# Patient Record
Sex: Male | Born: 1990 | Race: White | Hispanic: No | Marital: Single | Smoking: Never smoker
Health system: Southern US, Community
[De-identification: ages and names within clinical notes are randomized; demographics above are authoritative.]

## PROBLEM LIST (undated history)

## (undated) DIAGNOSIS — F909 Attention-deficit hyperactivity disorder, unspecified type: Secondary | ICD-10-CM

---

## 2015-03-27 ENCOUNTER — Encounter (HOSPITAL_COMMUNITY): Payer: Self-pay | Admitting: Emergency Medicine

## 2015-03-27 DIAGNOSIS — J159 Unspecified bacterial pneumonia: Secondary | ICD-10-CM | POA: Diagnosis not present

## 2015-03-27 DIAGNOSIS — Z8659 Personal history of other mental and behavioral disorders: Secondary | ICD-10-CM | POA: Insufficient documentation

## 2015-03-27 DIAGNOSIS — R251 Tremor, unspecified: Secondary | ICD-10-CM | POA: Insufficient documentation

## 2015-03-27 DIAGNOSIS — R110 Nausea: Secondary | ICD-10-CM | POA: Diagnosis not present

## 2015-03-27 DIAGNOSIS — R509 Fever, unspecified: Secondary | ICD-10-CM | POA: Diagnosis present

## 2015-03-27 NOTE — ED Notes (Signed)
C/o fever, generalized body aches, and productive cough with brown sputum since this morning.  States he took 2-3 Excedrin with Caffeine around 7pm for fever. Now c/o shaking all over.

## 2015-03-28 ENCOUNTER — Emergency Department (HOSPITAL_COMMUNITY)
Admission: EM | Admit: 2015-03-28 | Discharge: 2015-03-28 | Disposition: A | Discharge status: Home / Self Care | Payer: BLUE CROSS/BLUE SHIELD | Attending: Emergency Medicine | Admitting: Emergency Medicine

## 2015-03-28 ENCOUNTER — Emergency Department (HOSPITAL_COMMUNITY): Payer: BLUE CROSS/BLUE SHIELD

## 2015-03-28 DIAGNOSIS — R059 Cough, unspecified: Secondary | ICD-10-CM

## 2015-03-28 DIAGNOSIS — R05 Cough: Secondary | ICD-10-CM

## 2015-03-28 DIAGNOSIS — J189 Pneumonia, unspecified organism: Secondary | ICD-10-CM

## 2015-03-28 HISTORY — DX: Attention-deficit hyperactivity disorder, unspecified type: F90.9

## 2015-03-28 LAB — COMPREHENSIVE METABOLIC PANEL
ALT: 18 U/L (ref 17–63)
AST: 28 U/L (ref 15–41)
Albumin: 4.5 g/dL (ref 3.5–5.0)
Alkaline Phosphatase: 57 U/L (ref 38–126)
Anion gap: 11 (ref 5–15)
BUN: 6 mg/dL (ref 6–20)
CHLORIDE: 104 mmol/L (ref 101–111)
CO2: 26 mmol/L (ref 22–32)
Calcium: 9.6 mg/dL (ref 8.9–10.3)
Creatinine, Ser: 1.23 mg/dL (ref 0.61–1.24)
GFR calc Af Amer: >60 mL/min (ref >60)
GFR calc non Af Amer: >60 mL/min (ref >60)
GLUCOSE: 98 mg/dL (ref 65–99)
Potassium: 3.5 mmol/L (ref 3.5–5.1)
Sodium: 141 mmol/L (ref 135–145)
Total Bilirubin: 0.5 mg/dL (ref 0.3–1.2)
Total Protein: 7.1 g/dL (ref 6.5–8.1)

## 2015-03-28 LAB — CBC WITH DIFFERENTIAL/PLATELET
Basophils Absolute: 0 10*3/uL (ref 0.0–0.1)
Basophils Relative: 0 % (ref 0–1)
EOS PCT: 0 % (ref 0–5)
Eosinophils Absolute: 0 10*3/uL (ref 0.0–0.7)
HCT: 42.1 % (ref 39.0–52.0)
HEMOGLOBIN: 14.9 g/dL (ref 13.0–17.0)
LYMPHS ABS: 1.3 10*3/uL (ref 0.7–4.0)
Lymphocytes Relative: 17 % (ref 12–46)
MCH: 31.4 pg (ref 26.0–34.0)
MCHC: 35.4 g/dL (ref 30.0–36.0)
MCV: 88.6 fL (ref 78.0–100.0)
Monocytes Absolute: 0.8 10*3/uL (ref 0.1–1.0)
Monocytes Relative: 11 % (ref 3–12)
Neutro Abs: 5.2 10*3/uL (ref 1.7–7.7)
Neutrophils Relative %: 72 % (ref 43–77)
PLATELETS: 204 10*3/uL (ref 150–400)
RBC: 4.75 MIL/uL (ref 4.22–5.81)
RDW: 12.4 % (ref 11.5–15.5)
WBC: 7.3 10*3/uL (ref 4.0–10.5)

## 2015-03-28 MED ORDER — AZITHROMYCIN 250 MG PO TABS
500.0000 mg | ORAL_TABLET | Freq: Once | ORAL | Status: AC
  Administered 2015-03-28: 500 mg via ORAL
  Filled 2015-03-28: qty 2

## 2015-03-28 MED ORDER — SODIUM CHLORIDE 0.9 % IV BOLUS (SEPSIS)
1000.0000 mL | Freq: Once | INTRAVENOUS | Status: AC
  Administered 2015-03-28: 1000 mL via INTRAVENOUS

## 2015-03-28 MED ORDER — AZITHROMYCIN 250 MG PO TABS: 250.0000 mg | ORAL_TABLET | Freq: Every day | ORAL | Status: DC

## 2015-03-28 MED ORDER — ACETAMINOPHEN 500 MG PO TABS
1000.0000 mg | ORAL_TABLET | Freq: Once | ORAL | Status: AC
  Administered 2015-03-28: 1000 mg via ORAL
  Filled 2015-03-28: qty 2

## 2015-03-28 NOTE — Discharge Instructions (Signed)

## 2015-03-28 NOTE — ED Provider Notes (Signed)
CSN: [id number]     Arrival date & time 03/27/15  2348 History   First MD Initiated Contact with Patient 03/28/15 0107     This chart was scribed for Mirian Mo, MD by Arlan Organ, ED Scribe. This patient was seen in room D34C/D34C and the patient's care was started 1:10 AM.   Chief Complaint  Patient presents with  . Fever  . Shaking   The history is provided by the patient. No language interpreter was used.    HPI Comments: Jesus Tyler is a 24 y.o. male without any pertinent past medical history who presents to the Emergency Department complaining of constant, ongoing, mildly worsened fever onset this morning. He reports a fever of 100 at its highest. Pt also reports generalized body aches, productive cough consisting of brown sputum, and mild nausea. OTC Excedrin attempted prior to arrival without any improvement. No recent fever, chills, abdominal pain, or vomiting. 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 No known allergies to medications.  Past Medical History  Diagnosis Date  . ADHD (attention deficit hyperactivity disorder)    History reviewed. No pertinent past surgical history. No family history on file. History  Substance Use Topics  . Smoking status: Never Smoker   . Smokeless tobacco: Not on file  . Alcohol Use: Yes    Review of Systems  Constitutional: Positive for fever. Negative for chills.  Respiratory: Positive for cough.   Gastrointestinal: Positive for nausea. Negative for vomiting and abdominal pain.  Musculoskeletal: Positive for myalgias.  All other systems reviewed and are negative.     Allergies  Review of patient's allergies indicates not on file.  Home Medications   Prior to Admission medications   Medication Sig Start Date End Date Taking? Authorizing Provider  azithromycin (ZITHROMAX) 250 MG tablet Take 1 tablet (250 mg total) by mouth daily. 03/28/15   Mirian Mo, MD   Triage Vitals: BP 105/44 mmHg  Pulse 97  Temp(Src) 102.3 F  (39.1 C) (Oral)  Resp 18  Ht 5\' 11"  (1.803 m)  Wt 170 lb (77.111 kg)  BMI 23.72 kg/m2  SpO2 96%   Physical Exam  Constitutional: He is oriented to person, place, and time. He appears well-developed and well-nourished.  HENT:  Head: Normocephalic and atraumatic.  Eyes: Conjunctivae and EOM are normal.  Neck: Normal range of motion. Neck supple.  Cardiovascular: Normal rate, regular rhythm and normal heart sounds.   Pulmonary/Chest: Effort normal. No respiratory distress. He has rales in the right lower field.  Abdominal: He exhibits no distension. There is no tenderness. There is no rebound and no guarding.  Musculoskeletal: Normal range of motion.  Neurological: He is alert and oriented to person, place, and time.  Skin: Skin is warm and dry.  Vitals reviewed.   ED Course  Procedures (including critical care time)  DIAGNOSTIC STUDIES: Oxygen Saturation is 100% on RA, Normal by my interpretation.    COORDINATION OF CARE: 1:10 AM- Will give fluids and Tylenol. Will order CXR, CBC, CMP, and EKG. Discussed treatment plan with pt at bedside and pt agreed to plan.     Labs Review Labs Reviewed  CBC WITH DIFFERENTIAL/PLATELET  COMPREHENSIVE METABOLIC PANEL    Imaging Review Dg Chest 2 View  03/28/2015   CLINICAL DATA:  Cough and shortness of breath for 2 days, history of asthma.  EXAM: CHEST  2 VIEW  COMPARISON:  None.  FINDINGS: RIGHT lower lobe mild consolidation. No pleural effusion. Cardiomediastinal silhouette is unremarkable. No  pneumothorax. Soft tissue planes and included osseous structures are nonsuspicious.  IMPRESSION: RIGHT lower lobe pneumonia.   Electronically Signed   By: Awilda Metro M.D.   On: 03/28/2015 01:22     EKG Interpretation   Date/Time:  Wednesday March 27 2015 23:57:12 EDT Ventricular Rate:  125 PR Interval:  144 QRS Duration: 88 QT Interval:  292 QTC Calculation: 421 R Axis:   104 Text Interpretation:  Sinus tachycardia Right atrial  enlargement Rightward  axis T wave abnormality, consider inferior ischemia Abnormal ECG No old  tracing to compare Confirmed by Mirian Mo [phone]) on 03/28/2015  1:32:03 AM      MDM   Final diagnoses:  Community acquired pneumonia    24 y.o. male without pertinent PMH presents with cough.  Wu with PNA.  Not hypoxic, well appearing, and tachycardia improved with NS bolus and tylenol.  Azithro given in ed, and prescription for same given.  DC home in stable condition.    I have reviewed all laboratory and imaging studies if ordered as above  1. Community acquired pneumonia   2. Cough           Mirian Mo, MD 03/28/15 [phone]

## 2015-03-28 NOTE — ED Notes (Signed)
Dr. Gentry at bedside. 

## 2015-03-28 NOTE — ED Notes (Signed)
Pt reports he feels much better. Pt A&Ox4, ambulatory at d/c with steady gait NAD

## 2015-06-22 ENCOUNTER — Ambulatory Visit (INDEPENDENT_AMBULATORY_CARE_PROVIDER_SITE_OTHER): Payer: BLUE CROSS/BLUE SHIELD | Admitting: Family Medicine

## 2015-06-22 VITALS — BP 116/68 | HR 82 | Temp 99.2°F | Ht 70.0 in | Wt 173.2 lb

## 2015-06-22 DIAGNOSIS — R0981 Nasal congestion: Secondary | ICD-10-CM | POA: Diagnosis not present

## 2015-06-22 DIAGNOSIS — R509 Fever, unspecified: Secondary | ICD-10-CM | POA: Diagnosis not present

## 2015-06-22 DIAGNOSIS — J029 Acute pharyngitis, unspecified: Secondary | ICD-10-CM

## 2015-06-22 DIAGNOSIS — R05 Cough: Secondary | ICD-10-CM | POA: Diagnosis not present

## 2015-06-22 DIAGNOSIS — R059 Cough, unspecified: Secondary | ICD-10-CM

## 2015-06-22 LAB — POCT RAPID STREP A (OFFICE): Rapid Strep A Screen: NEGATIVE

## 2015-06-22 MED ORDER — AZITHROMYCIN 250 MG PO TABS: ORAL_TABLET | ORAL | Status: DC

## 2015-06-22 MED ORDER — HYDROCODONE-HOMATROPINE 5-1.5 MG/5ML PO SYRP: 5.0000 mL | ORAL_SOLUTION | Freq: Every evening | ORAL | Status: DC | PRN

## 2015-06-22 MED ORDER — BENZONATATE 100 MG PO CAPS: 200.0000 mg | ORAL_CAPSULE | Freq: Two times a day (BID) | ORAL | Status: DC | PRN

## 2015-06-22 NOTE — Progress Notes (Signed)
Chief Complaint:  Chief Complaint  Patient presents with  . Cough    all symptoms x's 4 days  . Sore Throat  . Nasal Congestion    HPI: Jesus Tyler is a 24 y.o. male who reports to [facility] today complaining of 4 day hx of sore throat with nasal congestion, he has EIA, no current  wheezing or SOB , subjective fevers, took dayquil with minimal releif. He works Futures trader currently.  +minimal ear pain and also sinus congestion. He also works in [employer] and is around customers.   Past Medical History  Diagnosis Date  . ADHD (attention deficit hyperactivity disorder)    No past surgical history on file. Social History   Social History  . Marital Status: Single    Spouse Name: N/A  . Number of Children: N/A  . Years of Education: N/A   Social History Main Topics  . Smoking status: Never Smoker   . Smokeless tobacco: None  . Alcohol Use: Yes  . Drug Use: No  . Sexual Activity: Not Asked   Other Topics Concern  . None   Social History Narrative   No family history on file. No Known Allergies Prior to Admission medications   Medication Sig Start Date End Date Taking? Authorizing Provider  amphetamine-dextroamphetamine (ADDERALL) 20 MG tablet Take 20 mg by mouth daily.   Yes Historical Provider, MD  Pseudoephedrine-APAP-DM (DAYQUIL PO) Take by mouth.   Yes Historical Provider, MD     ROS: The patient denies fevers, chills, night sweats, unintentional weight loss, chest pain, palpitations, wheezing, dyspnea on exertion, nausea, vomiting, abdominal pain, dysuria, hematuria, melena, numbness, weakness, or tingling.   All other systems have been reviewed and were otherwise negative with the exception of those mentioned in the HPI and as above.    PHYSICAL EXAM: Filed Vitals:   06/22/15 1047  BP: 116/68  Pulse: 82  Temp: 99.2 F (37.3 C)   Body mass index is 24.85 kg/(m^2).   General: Alert, no acute distress HEENT:  Normocephalic, atraumatic,  oropharynx patent. EOMI, PERRLA Erythematous throat, no exudates, TM normal, +/- sinus tenderness, + erythematous/boggy nasal mucosa Cardiovascular:  Regular rate and rhythm, no rubs murmurs or gallops.  No Carotid bruits, radial pulse intact. No pedal edema.  Respiratory: Clear to auscultation bilaterally.  No wheezes, rales, or rhonchi.  No cyanosis, no use of accessory musculature Abdominal: No organomegaly, abdomen is soft and non-tender, positive bowel sounds. No masses. Skin: No rashes. Neurologic: Facial musculature symmetric. Psychiatric: Patient acts appropriately throughout our interaction. Lymphatic: No cervical or submandibular lymphadenopathy Musculoskeletal: Gait intact. No edema, tenderness   LABS: Results for orders placed or performed in visit on 06/22/15  POCT rapid strep A  Result Value Ref Range   Rapid Strep A Screen Negative Negative     EKG/XRAY:   Primary read interpreted by Dr. Conley Rolls at [facility].   ASSESSMENT/PLAN: Encounter Diagnoses  Name Primary?  . Acute pharyngitis, unspecified pharyngitis type Yes  . Fever and chills   . Cough   . Sinus congestion    Rx Tessalon pearls, hycodan prn qhs Rx Nasacort otc Will give azithromycin if no improvement in 1 week may take it Strep cx pending     Gross sideeffects, risk and benefits, and alternatives of medications d/w patient. Patient is aware that all medications have potential sideeffects and we are unable to predict every sideeffect or drug-drug interaction that may occur.  Ether Goebel DO  06/22/2015  11:53 AM

## 2015-06-22 NOTE — Patient Instructions (Signed)
Upper Respiratory Infection, Adult An upper respiratory infection (URI) is also sometimes known as the common cold. The upper respiratory tract includes the nose, sinuses, throat, trachea, and bronchi. Bronchi are the airways leading to the lungs. Most people improve within 1 week, but symptoms can last up to 2 weeks. A residual cough may last even longer.  CAUSES Many different viruses can infect the tissues lining the upper respiratory tract. The tissues become irritated and inflamed and often become very moist. Mucus production is also common. A cold is contagious. You can easily spread the virus to others by oral contact. This includes kissing, sharing a glass, coughing, or sneezing. Touching your mouth or nose and then touching a surface, which is then touched by another person, can also spread the virus. SYMPTOMS  Symptoms typically develop 1 to 3 days after you come in contact with a cold virus. Symptoms vary from person to person. They may include:  Runny nose.  Sneezing.  Nasal congestion.  Sinus irritation.  Sore throat.  Loss of voice (laryngitis).  Cough.  Fatigue.  Muscle aches.  Loss of appetite.  Headache.  Low-grade fever. DIAGNOSIS  You might diagnose your own cold based on familiar symptoms, since most people get a cold 2 to 3 times a year. Your caregiver can confirm this based on your exam. Most importantly, your caregiver can check that your symptoms are not due to another disease such as strep throat, sinusitis, pneumonia, asthma, or epiglottitis. Blood tests, throat tests, and X-rays are not necessary to diagnose a common cold, but they may sometimes be helpful in excluding other more serious diseases. Your caregiver will decide if any further tests are required. RISKS AND COMPLICATIONS  You may be at risk for a more severe case of the common cold if you smoke cigarettes, have chronic heart disease (such as heart failure) or lung disease (such as asthma), or if  you have a weakened immune system. The very young and very old are also at risk for more serious infections. Bacterial sinusitis, middle ear infections, and bacterial pneumonia can complicate the common cold. The common cold can worsen asthma and chronic obstructive pulmonary disease (COPD). Sometimes, these complications can require emergency medical care and may be life-threatening. PREVENTION  The best way to protect against getting a cold is to practice good hygiene. Avoid oral or hand contact with people with cold symptoms. Wash your hands often if contact occurs. There is no clear evidence that vitamin C, vitamin E, echinacea, or exercise reduces the chance of developing a cold. However, it is always recommended to get plenty of rest and practice good nutrition. TREATMENT  Treatment is directed at relieving symptoms. There is no cure. Antibiotics are not effective, because the infection is caused by a virus, not by bacteria. Treatment may include:  Increased fluid intake. Sports drinks offer valuable electrolytes, sugars, and fluids.  Breathing heated mist or steam (vaporizer or shower).  Eating chicken soup or other clear broths, and maintaining good nutrition.  Getting plenty of rest.  Using gargles or lozenges for comfort.  Controlling fevers with ibuprofen or acetaminophen as directed by your caregiver.  Increasing usage of your inhaler if you have asthma. Zinc gel and zinc lozenges, taken in the first 24 hours of the common cold, can shorten the duration and lessen the severity of symptoms. Pain medicines may help with fever, muscle aches, and throat pain. A variety of non-prescription medicines are available to treat congestion and runny nose. Your caregiver   can make recommendations and may suggest nasal or lung inhalers for other symptoms.  HOME CARE INSTRUCTIONS   Only take over-the-counter or prescription medicines for pain, discomfort, or fever as directed by your  caregiver.  Use a warm mist humidifier or inhale steam from a shower to increase air moisture. This may keep secretions moist and make it easier to breathe.  Drink enough water and fluids to keep your urine clear or pale yellow.  Rest as needed.  Return to work when your temperature has returned to normal or as your caregiver advises. You may need to stay home longer to avoid infecting others. You can also use a face mask and careful hand washing to prevent spread of the virus. SEEK MEDICAL CARE IF:   After the first few days, you feel you are getting worse rather than better.  You need your caregiver's advice about medicines to control symptoms.  You develop chills, worsening shortness of breath, or brown or red sputum. These may be signs of pneumonia.  You develop yellow or brown nasal discharge or pain in the face, especially when you bend forward. These may be signs of sinusitis.  You develop a fever, swollen neck glands, pain with swallowing, or white areas in the back of your throat. These may be signs of strep throat. SEEK IMMEDIATE MEDICAL CARE IF:   You have a fever.  You develop severe or persistent headache, ear pain, sinus pain, or chest pain.  You develop wheezing, a prolonged cough, cough up blood, or have a change in your usual mucus (if you have chronic lung disease).  You develop sore muscles or a stiff neck. Document Released: 03/31/2001 Document Revised: 12/28/2011 Document Reviewed: 01/10/2014 [employer] Patient Information 2015 [employer]. This information is not intended to replace advice given to you by your health care provider. Make sure you discuss any questions you have with your health care provider.  

## 2015-06-23 LAB — CULTURE, GROUP A STREP: Organism ID, Bacteria: NORMAL

## 2015-06-24 ENCOUNTER — Telehealth: Payer: Self-pay | Admitting: Family Medicine

## 2015-06-24 NOTE — Telephone Encounter (Signed)
Feeling better with sxs treatment, not taking abx.

## 2015-07-20 ENCOUNTER — Ambulatory Visit (INDEPENDENT_AMBULATORY_CARE_PROVIDER_SITE_OTHER): Payer: BLUE CROSS/BLUE SHIELD | Admitting: Family Medicine

## 2015-07-20 VITALS — BP 120/80 | HR 112 | Temp 99.1°F | Resp 18 | Ht 70.0 in | Wt 175.4 lb

## 2015-07-20 DIAGNOSIS — J069 Acute upper respiratory infection, unspecified: Secondary | ICD-10-CM

## 2015-07-20 DIAGNOSIS — J029 Acute pharyngitis, unspecified: Secondary | ICD-10-CM | POA: Diagnosis not present

## 2015-07-20 LAB — POCT RAPID STREP A (OFFICE): Rapid Strep A Screen: NEGATIVE

## 2015-07-20 NOTE — Progress Notes (Addendum)
Subjective:    Patient ID: Jesus Tyler, male    DOB: 12-Oct-1991, 24 y.o.   MRN: [id number] This chart was scribed for Meredith Staggers, MD by Littie Deeds, Medical Scribe. This patient was seen in Room 11 and the patient's care was started at 2:06 PM.   HPI HPI Comments: Jesus Tyler is a 24 y.o. male who presents to the [facility] complaining of gradual onset, worsening sore throat that started 3-4 days ago. Patient reports having associated productive cough of yellow sputum, voice hoarseness, sneezing, mild congestion, and postnasal drip. The sore throat is the worst of his symptoms. He has tried OTC cold medications for his symptoms. He is unsure if he has had a fever. Patient works as a Production assistant, radio at [employer] and is also in school for [employer]; he has been exposed to sick children. He was seen earlier last month and was prescribed Z-pak if needed, but did not end up taking any of it.   There are no active problems to display for this patient.  Past Medical History  Diagnosis Date  . ADHD (attention deficit hyperactivity disorder)    No past surgical history on file. No Known Allergies Prior to Admission medications   Medication Sig Start Date End Date Taking? Authorizing Provider  amphetamine-dextroamphetamine (ADDERALL) 20 MG tablet Take 20 mg by mouth daily.   Yes Historical Provider, MD  azithromycin (ZITHROMAX) 250 MG tablet Take 2 tab po now then 1 tb po daily for the next 4 days. Patient not taking: Reported on 07/20/2015 06/22/15   Thao P Le, DO  benzonatate (TESSALON) 100 MG capsule Take 2 capsules (200 mg total) by mouth 2 (two) times daily as needed. Patient not taking: Reported on 07/20/2015 06/22/15   Thao P Le, DO  HYDROcodone-homatropine (HYCODAN) 5-1.5 MG/5ML syrup Take 5 mLs by mouth at bedtime as needed. Patient not taking: Reported on 07/20/2015 06/22/15   Thao P Le, DO  Pseudoephedrine-APAP-DM (DAYQUIL PO) Take by mouth.    Historical  Provider, MD   Social History   Social History  . Marital Status: Single    Spouse Name: N/A  . Number of Children: N/A  . Years of Education: N/A   Occupational History  . Not on file.   Social History Main Topics  . Smoking status: Never Smoker   . Smokeless tobacco: Not on file  . Alcohol Use: Yes  . Drug Use: No  . Sexual Activity: Not on file   Other Topics Concern  . Not on file   Social History Narrative     Review of Systems  HENT: Positive for congestion, postnasal drip, sneezing, sore throat and voice change.   Respiratory: Positive for cough.   Hematological: Negative for adenopathy.       Objective:   Physical Exam  Constitutional: He is oriented to person, place, and time. He appears well-developed and well-nourished.  HENT:  Head: Normocephalic and atraumatic.  Right Ear: Tympanic membrane, external ear and ear canal normal.  Left Ear: Tympanic membrane, external ear and ear canal normal.  Nose: No rhinorrhea. Right sinus exhibits no maxillary sinus tenderness and no frontal sinus tenderness. Left sinus exhibits no maxillary sinus tenderness and no frontal sinus tenderness.  Mouth/Throat: Oropharynx is clear and moist and mucous membranes are normal. No oropharyngeal exudate or posterior oropharyngeal erythema.  Eyes: Conjunctivae are normal. Pupils are equal, round, and reactive to light.  Neck: Neck supple.  Cardiovascular: Regular rhythm, normal heart  sounds and intact distal pulses.  Tachycardia present.   No murmur heard. Pulmonary/Chest: Effort normal and breath sounds normal. He has no wheezes. He has no rhonchi. He has no rales.  Clear to auscultation bilaterally.   Abdominal: Soft. There is no tenderness.  Lymphadenopathy:    He has no cervical adenopathy.  Neurological: He is alert and oriented to person, place, and time.  Skin: Skin is warm and dry. No rash noted.  Psychiatric: He has a normal mood and affect. His behavior is normal.    Vitals reviewed.  Results for orders placed or performed in visit on 07/20/15  POCT rapid strep A  Result Value Ref Range   Rapid Strep A Screen Negative Negative      Filed Vitals:   07/20/15 1356  BP: 120/80  Pulse: 112  Temp: 99.1 F (37.3 C)  TempSrc: Oral  Resp: 18  Height:  (1.778 m)  Weight: 175 lb 6 oz (79.55 kg)  SpO2: 99%       Assessment & Plan:  Jesus Tyler is a 24 y.o. male Sore throat - Plan: POCT rapid strep A, Culture, Group A Strep  Acute upper respiratory infection  Suspected viral upper respiratory infection, with early laryngitis. Rapid strep testing negative, but throat culture sent as tachycardic and possible exposure to this at school.  Otherwise reassuring exam, so symptomatic care at this time. See AVS. RTC precautions. Note given for work for 2 days  No orders of the defined types were placed in this encounter.   Patient Instructions   Your strep test was negative, but we will send off a throat culture just to make sure. This appears to be due to a virus as we discussed. Saline nasal spray  If needed for nasal congestion, drink plenty of fluids and voice rest as much as possible, Cepacol or other cough drops over-the-counter as needed for sore throat, over the counter mucinex or mucinex DM for cough, drink plenty of fluids. Return to the clinic or go to the nearest emergency room if any of your symptoms worsen or new symptoms occur.  Laryngitis At the top of your windpipe is your voice box. It is the source of your voice. Inside your voice box are 2 bands of muscles called vocal cords. When you breathe, your vocal cords are relaxed and open so that air can get into the lungs. When you decide to say something, these cords come together and vibrate. The sound from these vibrations goes into your throat and comes out through your mouth as sound. Laryngitis is an inflammation of the vocal cords that causes hoarseness, cough, loss of voice, sore  throat, and dry throat. Laryngitis can be temporary (acute) or long-term (chronic). Most cases of acute laryngitis improve with time.Chronic laryngitis lasts for more than 3 weeks. CAUSES Laryngitis can often be related to excessive smoking, talking, or yelling, as well as inhalation of toxic fumes and allergies. Acute laryngitis is usually caused by a viral infection, vocal strain, measles or mumps, or bacterial infections. Chronic laryngitis is usually caused by vocal cord strain, vocal cord injury, postnasal drip, growths on the vocal cords, or acid reflux. SYMPTOMS   Cough.  Sore throat.  Dry throat. RISK FACTORS  Respiratory infections.  Exposure to irritating substances, such as cigarette smoke, excessive amounts of alcohol, stomach acids, and workplace chemicals.  Voice trauma, such as vocal cord injury from shouting or speaking too loud. DIAGNOSIS  Your cargiver will perform a physical exam.  During the physical exam, your caregiver will examine your throat. The most common sign of laryngitis is hoarseness. Laryngoscopy may be necessary to confirm the diagnosis of this condition. This procedure allows your caregiver to look into the larynx. HOME CARE INSTRUCTIONS  Drink enough fluids to keep your urine clear or pale yellow.  Rest until you no longer have symptoms or as directed by your caregiver.  Breathe in moist air.  Take all medicine as directed by your caregiver.  Do not smoke.  Talk as little as possible (this includes whispering).  Write on paper instead of talking until your voice is back to normal.  Follow up with your caregiver if your condition has not improved after 10 days. SEEK MEDICAL CARE IF:   You have trouble breathing.  You cough up blood.  You have persistent fever.  You have increasing pain.  You have difficulty swallowing. MAKE SURE YOU:  Understand these instructions.  Will watch your condition.  Will get help right away if you are  not doing well or get worse. Document Released: 10/05/2005 Document Revised: 12/28/2011 Document Reviewed: 12/11/2010 [facility] Patient Information 2015 [place], [place]. This information is not intended to replace advice given to you by your health care provider. Make sure you discuss any questions you have with your health care provider.   Upper Respiratory Infection, Adult An upper respiratory infection (URI) is also sometimes known as the common cold. The upper respiratory tract includes the nose, sinuses, throat, trachea, and bronchi. Bronchi are the airways leading to the lungs. Most people improve within 1 week, but symptoms can last up to 2 weeks. A residual cough may last even longer.  CAUSES Many different viruses can infect the tissues lining the upper respiratory tract. The tissues become irritated and inflamed and often become very moist. Mucus production is also common. A cold is contagious. You can easily spread the virus to others by oral contact. This includes kissing, sharing a glass, coughing, or sneezing. Touching your mouth or nose and then touching a surface, which is then touched by another person, can also spread the virus. SYMPTOMS  Symptoms typically develop 1 to 3 days after you come in contact with a cold virus. Symptoms vary from person to person. They may include:  Runny nose.  Sneezing.  Nasal congestion.  Sinus irritation.  Sore throat.  Loss of voice (laryngitis).  Cough.  Fatigue.  Muscle aches.  Loss of appetite.  Headache.  Low-grade fever. DIAGNOSIS  You might diagnose your own cold based on familiar symptoms, since most people get a cold 2 to 3 times a year. Your caregiver can confirm this based on your exam. Most importantly, your caregiver can check that your symptoms are not due to another disease such as strep throat, sinusitis, pneumonia, asthma, or epiglottitis. Blood tests, throat tests, and X-rays are not necessary to diagnose a common  cold, but they may sometimes be helpful in excluding other more serious diseases. Your caregiver will decide if any further tests are required. RISKS AND COMPLICATIONS  You may be at risk for a more severe case of the common cold if you smoke cigarettes, have chronic heart disease (such as heart failure) or lung disease (such as asthma), or if you have a weakened immune system. The very young and very old are also at risk for more serious infections. Bacterial sinusitis, middle ear infections, and bacterial pneumonia can complicate the common cold. The common cold can worsen asthma and chronic obstructive pulmonary disease (  COPD). Sometimes, these complications can require emergency medical care and may be life-threatening. PREVENTION  The best way to protect against getting a cold is to practice good hygiene. Avoid oral or hand contact with people with cold symptoms. Wash your hands often if contact occurs. There is no clear evidence that vitamin C, vitamin E, echinacea, or exercise reduces the chance of developing a cold. However, it is always recommended to get plenty of rest and practice good nutrition. TREATMENT  Treatment is directed at relieving symptoms. There is no cure. Antibiotics are not effective, because the infection is caused by a virus, not by bacteria. Treatment may include:  Increased fluid intake. Sports drinks offer valuable electrolytes, sugars, and fluids.  Breathing heated mist or steam (vaporizer or shower).  Eating chicken soup or other clear broths, and maintaining good nutrition.  Getting plenty of rest.  Using gargles or lozenges for comfort.  Controlling fevers with ibuprofen or acetaminophen as directed by your caregiver.  Increasing usage of your inhaler if you have asthma. Zinc gel and zinc lozenges, taken in the first 24 hours of the common cold, can shorten the duration and lessen the severity of symptoms. Pain medicines may help with fever, muscle aches, and  throat pain. A variety of non-prescription medicines are available to treat congestion and runny nose. Your caregiver can make recommendations and may suggest nasal or lung inhalers for other symptoms.  HOME CARE INSTRUCTIONS   Only take over-the-counter or prescription medicines for pain, discomfort, or fever as directed by your caregiver.  Use a warm mist humidifier or inhale steam from a shower to increase air moisture. This may keep secretions moist and make it easier to breathe.  Drink enough water and fluids to keep your urine clear or pale yellow.  Rest as needed.  Return to work when your temperature has returned to normal or as your caregiver advises. You may need to stay home longer to avoid infecting others. You can also use a face mask and careful hand washing to prevent spread of the virus. SEEK MEDICAL CARE IF:   After the first few days, you feel you are getting worse rather than better.  You need your caregiver's advice about medicines to control symptoms.  You develop chills, worsening shortness of breath, or brown or red sputum. These may be signs of pneumonia.  You develop yellow or brown nasal discharge or pain in the face, especially when you bend forward. These may be signs of sinusitis.  You develop a fever, swollen neck glands, pain with swallowing, or white areas in the back of your throat. These may be signs of strep throat. SEEK IMMEDIATE MEDICAL CARE IF:   You have a fever.  You develop severe or persistent headache, ear pain, sinus pain, or chest pain.  You develop wheezing, a prolonged cough, cough up blood, or have a change in your usual mucus (if you have chronic lung disease).  You develop sore muscles or a stiff neck. Document Released: 03/31/2001 Document Revised: 12/28/2011 Document Reviewed: 01/10/2014 [facility] Patient Information 2015 [place], [place]. This information is not intended to replace advice given to you by your health care provider.  Make sure you discuss any questions you have with your health care provider.     I personally performed the services described in this documentation, which was scribed in my presence. The recorded information has been reviewed and considered, and addended by me as needed.    By signing my name below, I, Richard  Sun, attest that this documentation has been prepared under the direction and in the presence of Meredith Staggers, MD.  Electronically Signed: Littie Deeds, Medical Scribe. 07/20/2015. 2:06 PM.

## 2015-07-20 NOTE — Patient Instructions (Signed)
Your strep test was negative, but we will send off a throat culture just to make sure. This appears to be due to a virus as we discussed. Saline nasal spray  If needed for nasal congestion, drink plenty of fluids and voice rest as much as possible, Cepacol or other cough drops over-the-counter as needed for sore throat, over the counter mucinex or mucinex DM for cough, drink plenty of fluids. Return to the clinic or go to the nearest emergency room if any of your symptoms worsen or new symptoms occur.  Laryngitis At the top of your windpipe is your voice box. It is the source of your voice. Inside your voice box are 2 bands of muscles called vocal cords. When you breathe, your vocal cords are relaxed and open so that air can get into the lungs. When you decide to say something, these cords come together and vibrate. The sound from these vibrations goes into your throat and comes out through your mouth as sound. Laryngitis is an inflammation of the vocal cords that causes hoarseness, cough, loss of voice, sore throat, and dry throat. Laryngitis can be temporary (acute) or long-term (chronic). Most cases of acute laryngitis improve with time.Chronic laryngitis lasts for more than 3 weeks. CAUSES Laryngitis can often be related to excessive smoking, talking, or yelling, as well as inhalation of toxic fumes and allergies. Acute laryngitis is usually caused by a viral infection, vocal strain, measles or mumps, or bacterial infections. Chronic laryngitis is usually caused by vocal cord strain, vocal cord injury, postnasal drip, growths on the vocal cords, or acid reflux. SYMPTOMS   Cough.  Sore throat.  Dry throat. RISK FACTORS  Respiratory infections.  Exposure to irritating substances, such as cigarette smoke, excessive amounts of alcohol, stomach acids, and workplace chemicals.  Voice trauma, such as vocal cord injury from shouting or speaking too loud. DIAGNOSIS  Your cargiver will perform a  physical exam. During the physical exam, your caregiver will examine your throat. The most common sign of laryngitis is hoarseness. Laryngoscopy may be necessary to confirm the diagnosis of this condition. This procedure allows your caregiver to look into the larynx. HOME CARE INSTRUCTIONS  Drink enough fluids to keep your urine clear or pale yellow.  Rest until you no longer have symptoms or as directed by your caregiver.  Breathe in moist air.  Take all medicine as directed by your caregiver.  Do not smoke.  Talk as little as possible (this includes whispering).  Write on paper instead of talking until your voice is back to normal.  Follow up with your caregiver if your condition has not improved after 10 days. SEEK MEDICAL CARE IF:   You have trouble breathing.  You cough up blood.  You have persistent fever.  You have increasing pain.  You have difficulty swallowing. MAKE SURE YOU:  Understand these instructions.  Will watch your condition.  Will get help right away if you are not doing well or get worse. Document Released: 10/05/2005 Document Revised: 12/28/2011 Document Reviewed: 12/11/2010 [facility] Patient Information 2015 [place], [place]. This information is not intended to replace advice given to you by your health care provider. Make sure you discuss any questions you have with your health care provider.   Upper Respiratory Infection, Adult An upper respiratory infection (URI) is also sometimes known as the common cold. The upper respiratory tract includes the nose, sinuses, throat, trachea, and bronchi. Bronchi are the airways leading to the lungs. Most people improve within 1  week, but symptoms can last up to 2 weeks. A residual cough may last even longer.  CAUSES Many different viruses can infect the tissues lining the upper respiratory tract. The tissues become irritated and inflamed and often become very moist. Mucus production is also common. A cold is  contagious. You can easily spread the virus to others by oral contact. This includes kissing, sharing a glass, coughing, or sneezing. Touching your mouth or nose and then touching a surface, which is then touched by another person, can also spread the virus. SYMPTOMS  Symptoms typically develop 1 to 3 days after you come in contact with a cold virus. Symptoms vary from person to person. They may include:  Runny nose.  Sneezing.  Nasal congestion.  Sinus irritation.  Sore throat.  Loss of voice (laryngitis).  Cough.  Fatigue.  Muscle aches.  Loss of appetite.  Headache.  Low-grade fever. DIAGNOSIS  You might diagnose your own cold based on familiar symptoms, since most people get a cold 2 to 3 times a year. Your caregiver can confirm this based on your exam. Most importantly, your caregiver can check that your symptoms are not due to another disease such as strep throat, sinusitis, pneumonia, asthma, or epiglottitis. Blood tests, throat tests, and X-rays are not necessary to diagnose a common cold, but they may sometimes be helpful in excluding other more serious diseases. Your caregiver will decide if any further tests are required. RISKS AND COMPLICATIONS  You may be at risk for a more severe case of the common cold if you smoke cigarettes, have chronic heart disease (such as heart failure) or lung disease (such as asthma), or if you have a weakened immune system. The very young and very old are also at risk for more serious infections. Bacterial sinusitis, middle ear infections, and bacterial pneumonia can complicate the common cold. The common cold can worsen asthma and chronic obstructive pulmonary disease (COPD). Sometimes, these complications can require emergency medical care and may be life-threatening. PREVENTION  The best way to protect against getting a cold is to practice good hygiene. Avoid oral or hand contact with people with cold symptoms. Wash your hands often if  contact occurs. There is no clear evidence that vitamin C, vitamin E, echinacea, or exercise reduces the chance of developing a cold. However, it is always recommended to get plenty of rest and practice good nutrition. TREATMENT  Treatment is directed at relieving symptoms. There is no cure. Antibiotics are not effective, because the infection is caused by a virus, not by bacteria. Treatment may include:  Increased fluid intake. Sports drinks offer valuable electrolytes, sugars, and fluids.  Breathing heated mist or steam (vaporizer or shower).  Eating chicken soup or other clear broths, and maintaining good nutrition.  Getting plenty of rest.  Using gargles or lozenges for comfort.  Controlling fevers with ibuprofen or acetaminophen as directed by your caregiver.  Increasing usage of your inhaler if you have asthma. Zinc gel and zinc lozenges, taken in the first 24 hours of the common cold, can shorten the duration and lessen the severity of symptoms. Pain medicines may help with fever, muscle aches, and throat pain. A variety of non-prescription medicines are available to treat congestion and runny nose. Your caregiver can make recommendations and may suggest nasal or lung inhalers for other symptoms.  HOME CARE INSTRUCTIONS   Only take over-the-counter or prescription medicines for pain, discomfort, or fever as directed by your caregiver.  Use a warm mist humidifier  or inhale steam from a shower to increase air moisture. This may keep secretions moist and make it easier to breathe.  Drink enough water and fluids to keep your urine clear or pale yellow.  Rest as needed.  Return to work when your temperature has returned to normal or as your caregiver advises. You may need to stay home longer to avoid infecting others. You can also use a face mask and careful hand washing to prevent spread of the virus. SEEK MEDICAL CARE IF:   After the first few days, you feel you are getting worse  rather than better.  You need your caregiver's advice about medicines to control symptoms.  You develop chills, worsening shortness of breath, or brown or red sputum. These may be signs of pneumonia.  You develop yellow or brown nasal discharge or pain in the face, especially when you bend forward. These may be signs of sinusitis.  You develop a fever, swollen neck glands, pain with swallowing, or white areas in the back of your throat. These may be signs of strep throat. SEEK IMMEDIATE MEDICAL CARE IF:   You have a fever.  You develop severe or persistent headache, ear pain, sinus pain, or chest pain.  You develop wheezing, a prolonged cough, cough up blood, or have a change in your usual mucus (if you have chronic lung disease).  You develop sore muscles or a stiff neck. Document Released: 03/31/2001 Document Revised: 12/28/2011 Document Reviewed: 01/10/2014 [facility] Patient Information 2015 [place], [place]. This information is not intended to replace advice given to you by your health care provider. Make sure you discuss any questions you have with your health care provider.

## 2015-07-21 LAB — CULTURE, GROUP A STREP: Organism ID, Bacteria: NORMAL

## 2016-07-29 IMAGING — DX DG CHEST 2V
2 series · 2 of 2 positions shown · non-contrast
Comparison: None.

CLINICAL DATA: Cough and shortness of breath for 2 days, history of
asthma.

EXAM:
CHEST  2 VIEW

[chest pa]
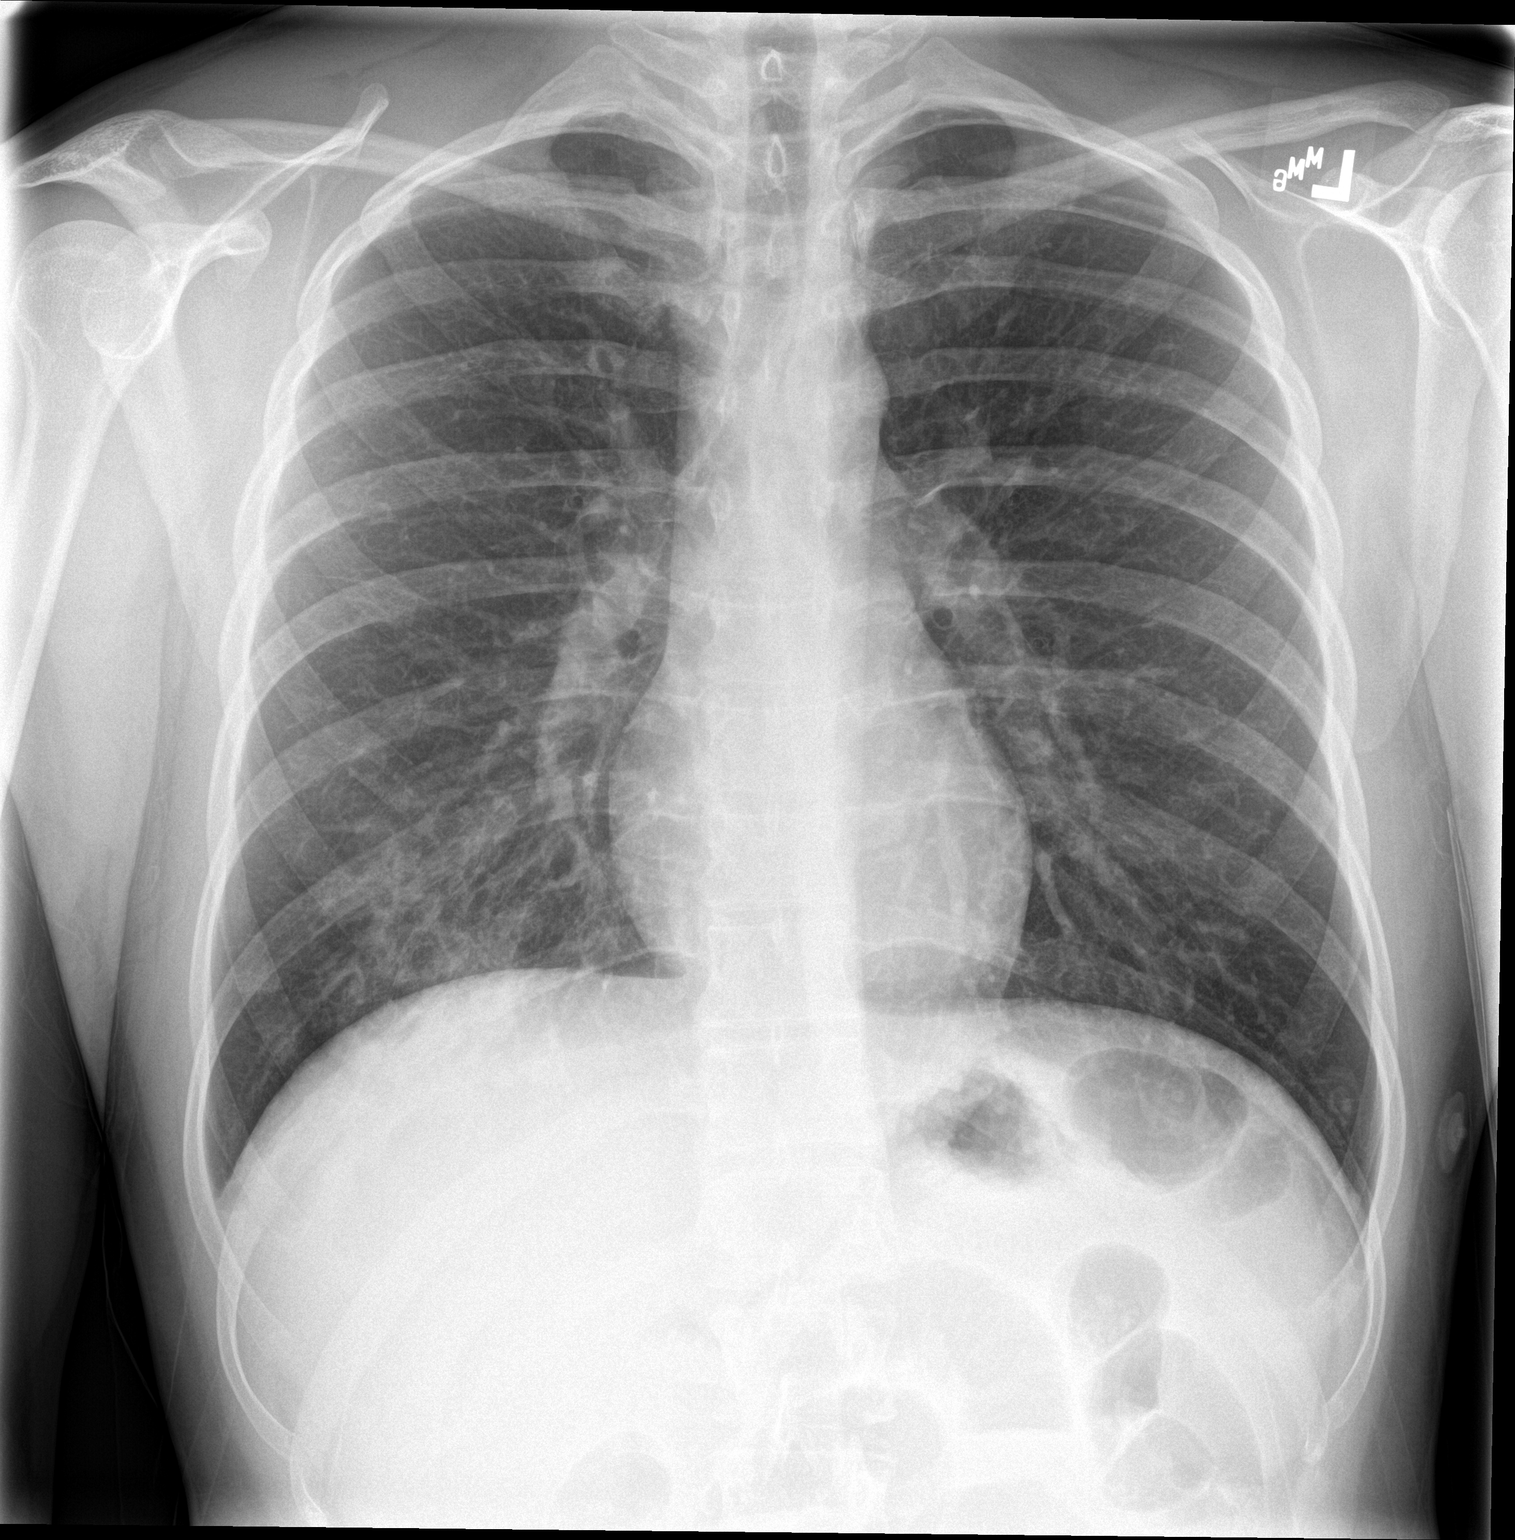

[chest lat]
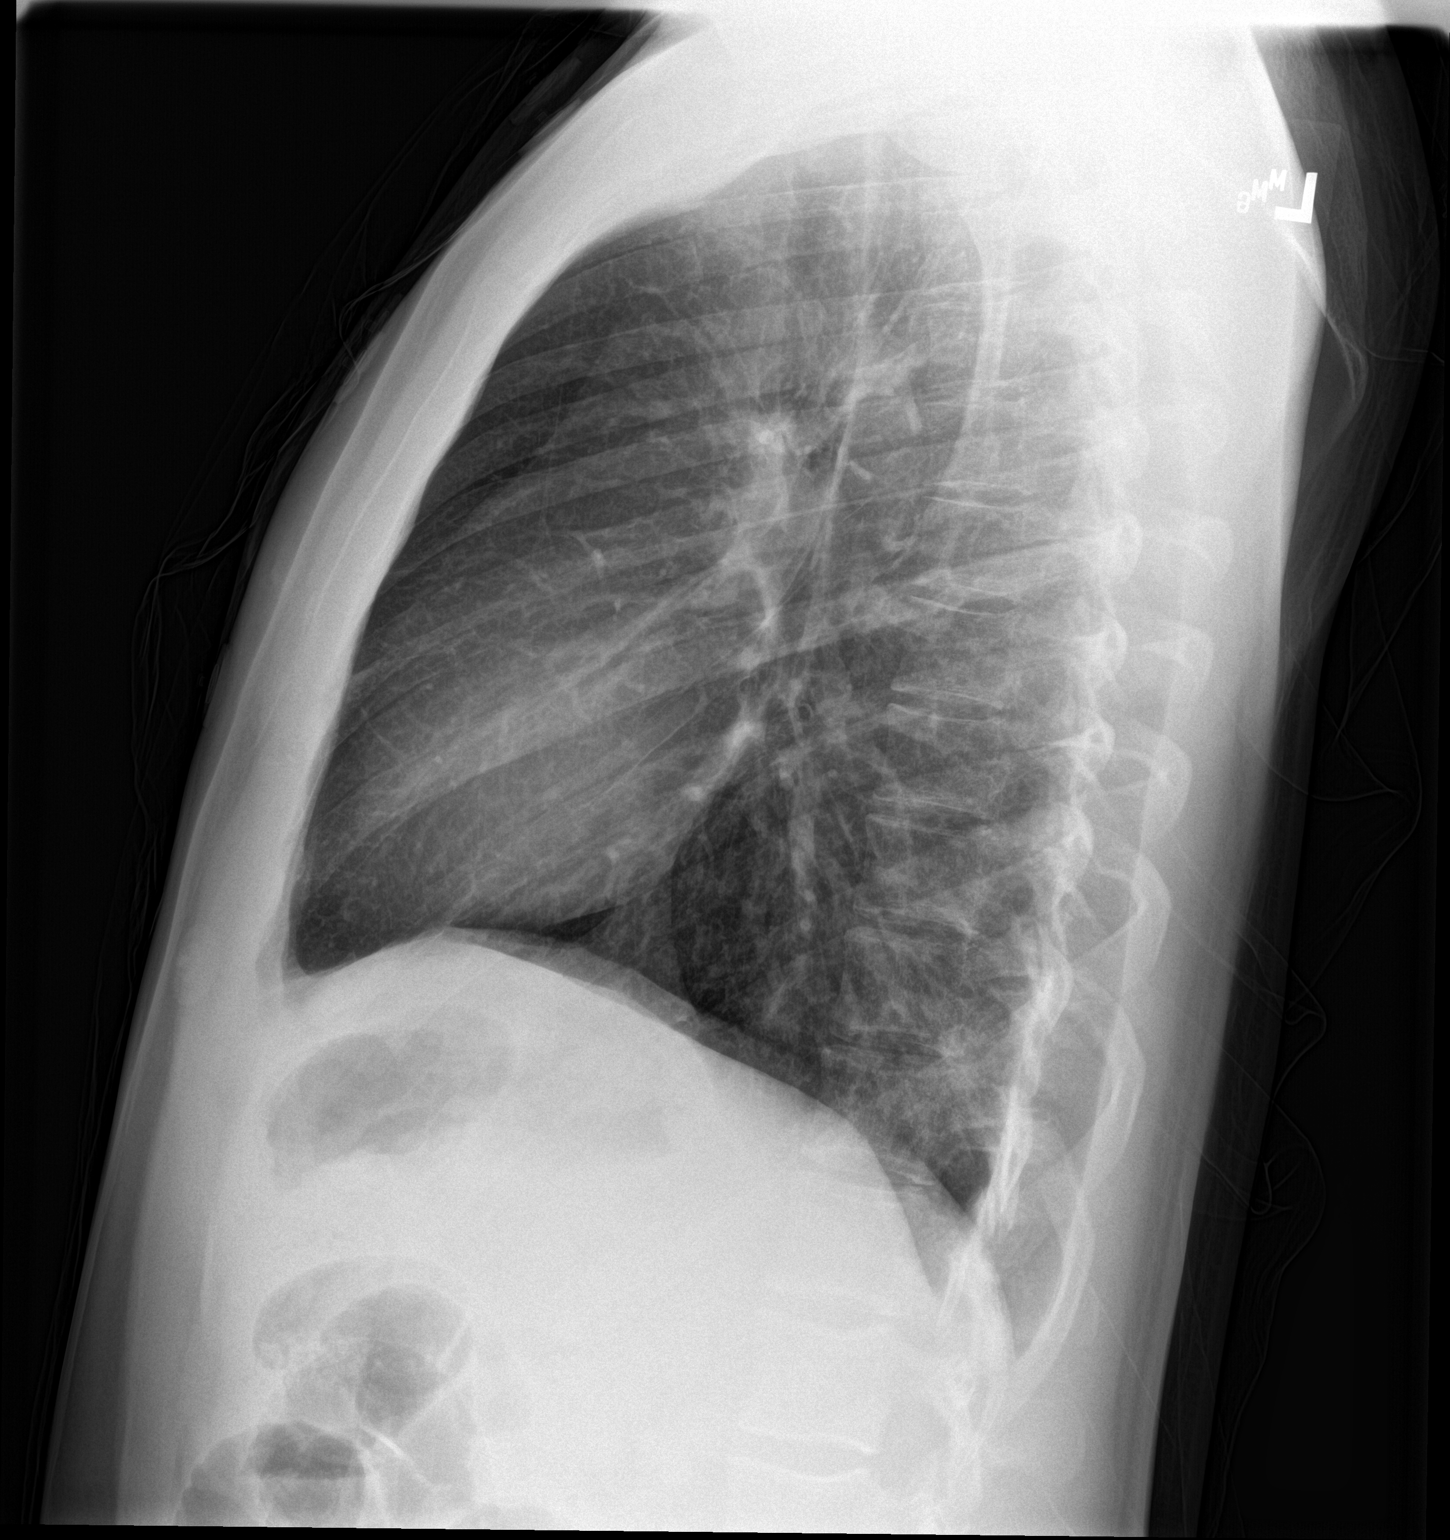

[2 of 2 positions shown; findings below may reference images not displayed]

FINDINGS: RIGHT lower lobe mild consolidation. No pleural effusion.
Cardiomediastinal silhouette is unremarkable. No pneumothorax. Soft
tissue planes and included osseous structures are nonsuspicious.
IMPRESSION: RIGHT lower lobe pneumonia.

## 2017-01-20 ENCOUNTER — Ambulatory Visit (HOSPITAL_COMMUNITY)
Admission: EM | Admit: 2017-01-20 | Discharge: 2017-01-20 | Disposition: A | Discharge status: Home / Self Care | Payer: BLUE CROSS/BLUE SHIELD

## 2017-01-20 ENCOUNTER — Encounter (HOSPITAL_COMMUNITY): Payer: Self-pay | Admitting: Emergency Medicine

## 2017-01-20 DIAGNOSIS — J00 Acute nasopharyngitis [common cold]: Secondary | ICD-10-CM

## 2017-01-20 DIAGNOSIS — R05 Cough: Secondary | ICD-10-CM

## 2017-01-20 DIAGNOSIS — R059 Cough, unspecified: Secondary | ICD-10-CM

## 2017-01-20 MED ORDER — IPRATROPIUM BROMIDE 0.06 % NA SOLN: 2.0000 | Freq: Four times a day (QID) | NASAL | 0 refills | Status: AC

## 2017-01-20 MED ORDER — BENZONATATE 100 MG PO CAPS: 200.0000 mg | ORAL_CAPSULE | Freq: Three times a day (TID) | ORAL | 0 refills | Status: AC | PRN

## 2017-01-20 MED ORDER — AZITHROMYCIN 250 MG PO TABS: 250.0000 mg | ORAL_TABLET | Freq: Every day | ORAL | 0 refills | Status: AC

## 2017-01-20 NOTE — ED Provider Notes (Signed)
CSN: [id number]     Arrival date & time 01/20/17  1216 History   First MD Initiated Contact with Patient 01/20/17 1244     Chief Complaint  Patient presents with  . URI   (Consider location/radiation/quality/duration/timing/severity/associated sxs/prior Treatment) Patient c/o URI sx's for 5 days.   The history is provided by the patient.  URI  Presenting symptoms: congestion and cough   Severity:  Moderate Onset quality:  Sudden Duration:  5 days Timing:  Constant Progression:  Unchanged Chronicity:  New Relieved by:  Nothing Worsened by:  Nothing Ineffective treatments:  None tried   Past Medical History:  Diagnosis Date  . ADHD (attention deficit hyperactivity disorder)    History reviewed. No pertinent surgical history. No family history on file. Social History  Substance Use Topics  . Smoking status: Never Smoker  . Smokeless tobacco: Not on file  . Alcohol use Yes    Review of Systems  Constitutional: Negative.   HENT: Positive for congestion.   Eyes: Negative.   Respiratory: Positive for cough.   Cardiovascular: Negative.   Gastrointestinal: Negative.   Endocrine: Negative.   Genitourinary: Negative.   Musculoskeletal: Negative.   Allergic/Immunologic: Negative.   Neurological: Negative.   Hematological: Negative.   Psychiatric/Behavioral: Negative.     Allergies  Patient has no known allergies.  Home Medications   Prior to Admission medications   Medication Sig Start Date End Date Taking? Authorizing Provider  amphetamine-dextroamphetamine (ADDERALL) 20 MG tablet Take 20 mg by mouth daily.   Yes Historical Provider, MD  OVER THE COUNTER MEDICATION [facility] severe cold tablets   Yes Historical Provider, MD  azithromycin (ZITHROMAX) 250 MG tablet Take 1 tablet (250 mg total) by mouth daily. Take first 2 tablets together, then 1 every day until finished. 01/20/17   Deatra Canter, FNP  benzonatate (TESSALON) 100 MG capsule Take 2 capsules (200 mg  total) by mouth 3 (three) times daily as needed for cough. 01/20/17   Deatra Canter, FNP  ipratropium (ATROVENT) 0.06 % nasal spray Place 2 sprays into both nostrils 4 (four) times daily. 01/20/17   Deatra Canter, FNP  Pseudoephedrine-APAP-DM (DAYQUIL PO) Take by mouth.    Historical Provider, MD   Meds Ordered and Administered this Visit  Medications - No data to display  BP 122/83 (BP Location: Right Arm)   Pulse (!) 107   Temp 99.1 F (37.3 C) (Oral)   Resp 18   SpO2 100%  No data found.   Physical Exam  Constitutional: He appears well-developed and well-nourished.  HENT:  Head: Normocephalic and atraumatic.  Eyes: Conjunctivae and EOM are normal. Pupils are equal, round, and reactive to light.  Neck: Normal range of motion. Neck supple.  Cardiovascular: Normal rate, regular rhythm and normal heart sounds.   Pulmonary/Chest: Effort normal and breath sounds normal.  Abdominal: Soft. Bowel sounds are normal.  Nursing note and vitals reviewed.   Urgent Care Course     Procedures (including critical care time)  Labs Review Labs Reviewed - No data to display  Imaging Review No results found.   Visual Acuity Review  Right Eye Distance:   Left Eye Distance:   Bilateral Distance:    Right Eye Near:   Left Eye Near:    Bilateral Near:         MDM   1. Acute nasopharyngitis   2. Cough    Zpak Tessalon Perles Atrovent Nasal Spray  Push po fluids, rest, tylenol and motrin otc  prn as directed for fever, arthralgias, and myalgias.  Follow up prn if sx's continue or persist.    Deatra Canter, FNP 01/20/17 1301

## 2017-01-20 NOTE — ED Triage Notes (Signed)
Tuesday night started having symptoms: cough, raw throat, chest congestion, general aches and pain
